# Patient Record
Sex: Male | Born: 1980 | Hispanic: No | Marital: Married | Smoking: Never smoker
Health system: Southern US, Community
[De-identification: ages and names within clinical notes are randomized; demographics above are authoritative.]

## PROBLEM LIST (undated history)

## (undated) DIAGNOSIS — R519 Headache, unspecified: Secondary | ICD-10-CM

## (undated) DIAGNOSIS — R51 Headache: Secondary | ICD-10-CM

## (undated) HISTORY — DX: Headache: R51

## (undated) HISTORY — DX: Headache, unspecified: R51.9

---

## 2016-06-19 ENCOUNTER — Emergency Department (HOSPITAL_COMMUNITY): Payer: 59

## 2016-06-19 ENCOUNTER — Encounter (HOSPITAL_COMMUNITY): Payer: Self-pay | Admitting: Emergency Medicine

## 2016-06-19 ENCOUNTER — Emergency Department (HOSPITAL_COMMUNITY)
Admission: EM | Admit: 2016-06-19 | Discharge: 2016-06-19 | Disposition: A | Discharge status: Home / Self Care | Payer: 59 | Attending: Emergency Medicine | Admitting: Emergency Medicine

## 2016-06-19 DIAGNOSIS — Z79899 Other long term (current) drug therapy: Secondary | ICD-10-CM | POA: Insufficient documentation

## 2016-06-19 DIAGNOSIS — R51 Headache: Secondary | ICD-10-CM | POA: Insufficient documentation

## 2016-06-19 DIAGNOSIS — R519 Headache, unspecified: Secondary | ICD-10-CM

## 2016-06-19 LAB — BASIC METABOLIC PANEL
Anion gap: 7 (ref 5–15)
BUN: 11 mg/dL (ref 6–20)
CHLORIDE: 101 mmol/L (ref 101–111)
CO2: 29 mmol/L (ref 22–32)
CREATININE: 0.91 mg/dL (ref 0.61–1.24)
Calcium: 9.2 mg/dL (ref 8.9–10.3)
GFR calc Af Amer: >60 mL/min (ref >60)
GFR calc non Af Amer: >60 mL/min (ref >60)
Glucose, Bld: 92 mg/dL (ref 65–99)
Potassium: 4 mmol/L (ref 3.5–5.1)
Sodium: 137 mmol/L (ref 135–145)

## 2016-06-19 LAB — CBC WITH DIFFERENTIAL/PLATELET
Basophils Absolute: 0 10*3/uL (ref 0.0–0.1)
Basophils Relative: 0 %
EOS ABS: 0.2 10*3/uL (ref 0.0–0.7)
EOS PCT: 2 %
HCT: 44.9 % (ref 39.0–52.0)
HEMOGLOBIN: 15.7 g/dL (ref 13.0–17.0)
LYMPHS ABS: 2.9 10*3/uL (ref 0.7–4.0)
Lymphocytes Relative: 34 %
MCH: 29.6 pg (ref 26.0–34.0)
MCHC: 35 g/dL (ref 30.0–36.0)
MCV: 84.6 fL (ref 78.0–100.0)
MONO ABS: 0.5 10*3/uL (ref 0.1–1.0)
MONOS PCT: 5 %
Neutro Abs: 5.1 10*3/uL (ref 1.7–7.7)
Neutrophils Relative %: 59 %
PLATELETS: 195 10*3/uL (ref 150–400)
RBC: 5.31 MIL/uL (ref 4.22–5.81)
RDW: 12.9 % (ref 11.5–15.5)
WBC: 8.6 10*3/uL (ref 4.0–10.5)

## 2016-06-19 MED ORDER — IOPAMIDOL (ISOVUE-370) INJECTION 76%
100.0000 mL | Freq: Once | INTRAVENOUS | Status: AC | PRN
  Administered 2016-06-19: 100 mL via INTRAVENOUS

## 2016-06-19 MED ORDER — IOPAMIDOL (ISOVUE-370) INJECTION 76%
INTRAVENOUS | Status: AC
  Administered 2016-06-19: 1 mL
  Filled 2016-06-19: qty 100

## 2016-06-19 NOTE — ED Provider Notes (Signed)
[facility]-EMERGENCY DEPT Provider Note   CSN: [id number] Arrival date & time: 06/19/16  1250     History   Chief Complaint Chief Complaint  Patient presents with  . Headache    HPI Sergio Hale is a 35 y.o. male.  The history is provided by the patient. No language interpreter was used.  Headache     Sergio Hale is a 35 y.o. male who presents to the Emergency Department complaining of headache.  He reports 5 days of headache. The headache is located at the top of his head and posterior. It is worse upon waking. It also worsens after intercourse for about one hour. He currently has no headache. He denies any fevers, nausea, vomiting, numbness, weakness, vision changes. No history of prior headaches. It does have partial improvement after taking ibuprofen. No recent head injury, carbon monoxide exposure, tick bites, illness. He has no past medical history. There is no family history of stroke or aneurysm. History reviewed. No pertinent past medical history.  There are no active problems to display for this patient.   History reviewed. No pertinent surgical history.     Home Medications    Prior to Admission medications   Medication Sig Start Date End Date Taking? Authorizing Provider  ibuprofen (ADVIL,MOTRIN) 200 MG tablet Take 200 mg by mouth every 6 (six) hours as needed for headache, mild pain or moderate pain.   Yes Historical Provider, MD    Family History No family history on file.  Social History Social History  Substance Use Topics  . Smoking status: Never Smoker  . Smokeless tobacco: Never Used  . Alcohol use No     Allergies   Patient has no known allergies.   Review of Systems Review of Systems  Neurological: Positive for headaches.  All other systems reviewed and are negative.    Physical Exam Updated Vital Signs BP 120/93 (BP Location: Right Arm)   Pulse 72   Temp 99 F (37.2 C) (Oral)   Resp 14   Ht 5\' 8"  (1.727 m)   Wt 175 lb  (79.4 kg)   SpO2 99%   BMI 26.61 kg/m   Physical Exam  Constitutional: He is oriented to person, place, and time. He appears well-developed and well-nourished.  HENT:  Head: Normocephalic and atraumatic.  Right Ear: External ear normal.  Left Ear: External ear normal.  Mouth/Throat: Oropharynx is clear and moist. No oropharyngeal exudate.  Eyes: EOM are normal. Pupils are equal, round, and reactive to light.  Neck: Normal range of motion. Neck supple.  Cardiovascular: Normal rate and regular rhythm.   No murmur heard. Pulmonary/Chest: Effort normal and breath sounds normal. No stridor. No respiratory distress.  Musculoskeletal: He exhibits no edema or tenderness.  Lymphadenopathy:    He has no cervical adenopathy.  Neurological: He is alert and oriented to person, place, and time. No cranial nerve deficit.  Skin: Skin is warm and dry.  Psychiatric: He has a normal mood and affect. His behavior is normal.  Nursing note and vitals reviewed.    ED Treatments / Results  Labs (all labs ordered are listed, but only abnormal results are displayed) Labs Reviewed  BASIC METABOLIC PANEL  CBC WITH DIFFERENTIAL/PLATELET    EKG  EKG Interpretation None       Radiology Ct Angio Head W/cm &/or Wo Cm  Result Date: 06/19/2016 CLINICAL DATA:  35 y/o M; five days of intermittent exertional headaches. EXAM: CT ANGIOGRAPHY HEAD TECHNIQUE: Multidetector CT imaging of the head  was performed using the standard protocol during bolus administration of intravenous contrast. Multiplanar CT image reconstructions and MIPs were obtained to evaluate the vascular anatomy. CONTRAST:  100 cc Isovue 370 COMPARISON:  None. FINDINGS: CT HEAD Brain: No evidence of acute infarction, hemorrhage, hydrocephalus, extra-axial collection or mass lesion/mass effect. Vascular: As below. Skull: Normal. Negative for fracture or focal lesion. Sinuses: Imaged portions are clear. Orbits: No acute finding. CTA HEAD Anterior  circulation: No proximal occlusion, aneurysm, or vascular malformation. Left A2 segments of moderate stenosis (series 14, image 19). Posterior circulation: No significant stenosis, proximal occlusion, aneurysm, or vascular malformation. Venous sinuses: As permitted by contrast timing, patent. Anatomic variants: Small anterior communicating artery and left posterior communicating artery. No right posterior communicating artery identified, likely hypoplastic or absent. Delayed phase: No abnormal intracranial enhancement. IMPRESSION: 1. Left A2 segment of moderate stenosis best appreciated on sagittal MIP reconstruction. Atherosclerotic disease would be atypical for patient's age. Findings suggest vasculitis or vasoconstriction possibly related to reversible cerebral vasoconstriction syndrome or drug reaction. No specific findings of dissection. 2. No aneurysm or large vessel occlusion of the circle of Willis identified. 3. No large acute infarct, focal mass effect, or intracranial hemorrhage on noncontrast CT of the head or abnormal enhancement of the brain on delayed postcontrast CT of head. Patent dural venous sinuses. These results were called by telephone at the time of interpretation on 06/19/2016 at 8:40 pm to Dr. Tilden FossaELIZABETH Kambrie Eddleman , who verbally acknowledged these results. Electronically Signed   By: Mitzi HansenLance  Furusawa-Stratton M.D.   On: 06/19/2016 20:44    Procedures Procedures (including critical care time)  Medications Ordered in ED Medications  iopamidol (ISOVUE-370) 76 % injection (1 mL  Contrast Given 06/19/16 1925)  iopamidol (ISOVUE-370) 76 % injection 100 mL (100 mLs Intravenous Contrast Given 06/19/16 1942)     Initial Impression / Assessment and Plan / ED Course  I have reviewed the triage vital signs and the nursing notes.  Pertinent labs & imaging results that were available during my care of the patient were reviewed by me and considered in my medical decision making (see chart for  details).  Clinical Course     Patient here for evaluation of waxing and waning headache that is worse with exertion. He is well appearing on examination with no neurologic deficits. CT scan demonstrates some stenosis that is likely not concerning to his headache symptoms. Plan to DC home with outpatient neurology follow-up. Current clinical picture is not consistent with CVA/TIA, subarachnoid, meningitis. Recommend the patient avoid heavy exertion until seen by neurology. Discussed taking ibuprofen, available over-the-counter as needed for headache.  Final Clinical Impressions(s) / ED Diagnoses   Final diagnoses:  Bad headache    New Prescriptions Discharge Medication List as of 06/19/2016  9:32 PM       Tilden FossaElizabeth Demarus Latterell, MD 06/20/16 [id number]

## 2016-06-19 NOTE — Discharge Instructions (Signed)
FINDINGS: CT HEAD   Brain: No evidence of acute infarction, hemorrhage, hydrocephalus, extra-axial collection or mass lesion/mass effect.   Vascular: As below.   Skull: Normal. Negative for fracture or focal lesion.   Sinuses: Imaged portions are clear.   Orbits: No acute finding.   CTA HEAD   Anterior circulation: No proximal occlusion, aneurysm, or vascular malformation.   Left A2 segments of moderate stenosis (series 14, image 19).   Posterior circulation: No significant stenosis, proximal occlusion, aneurysm, or vascular malformation.   Venous sinuses: As permitted by contrast timing, patent.   Anatomic variants: Small anterior communicating artery and left posterior communicating artery. No right posterior communicating artery identified, likely hypoplastic or absent.   Delayed phase: No abnormal intracranial enhancement.   IMPRESSION: 1. Left A2 segment of moderate stenosis best appreciated on sagittal MIP reconstruction. Atherosclerotic disease would be atypical for patient's age. Findings suggest vasculitis or vasoconstriction possibly related to reversible cerebral vasoconstriction syndrome or drug reaction. No specific findings of dissection. 2. No aneurysm or large vessel occlusion of the circle of Willis identified. 3. No large acute infarct, focal mass effect, or intracranial hemorrhage on noncontrast CT of the head or abnormal enhancement of the brain on delayed postcontrast CT of head. Patent dural venous sinuses.

## 2016-06-19 NOTE — ED Triage Notes (Signed)
patient sent from [facility] for further work up on headache. Patient states that intermittent headache over the past 5 days. patient states that worse with sexual activity. Patient states that 2-3 morning woke up with headache. Patient denies blurred vision or n/v.

## 2016-06-21 ENCOUNTER — Encounter: Payer: Self-pay | Admitting: Diagnostic Neuroimaging

## 2016-06-21 ENCOUNTER — Ambulatory Visit (INDEPENDENT_AMBULATORY_CARE_PROVIDER_SITE_OTHER): Payer: 59 | Admitting: Diagnostic Neuroimaging

## 2016-06-21 VITALS — BP 128/81 | HR 70 | Ht 69.0 in | Wt 178.4 lb

## 2016-06-21 DIAGNOSIS — G4484 Primary exertional headache: Secondary | ICD-10-CM | POA: Diagnosis not present

## 2016-06-21 MED ORDER — PROPRANOLOL HCL 20 MG PO TABS: 20.0000 mg | ORAL_TABLET | Freq: Two times a day (BID) | ORAL | 6 refills | Status: DC

## 2016-06-21 NOTE — Progress Notes (Signed)
[facility]  PATIENT: Sergio Hale DOB: [birth year]  REFERRING CLINICIAN: ER  HISTORY FROM: patient and wife  REASON FOR VISIT: new consult    HISTORICAL  CHIEF COMPLAINT:  Chief Complaint  Patient presents with  . Headache    rm 7, New Pt, wife- Rajee, "headache x 6-7 days on top of head and sometimes to the left"    HISTORY OF PRESENT ILLNESS:   35 year old right-handed male here for evaluation of headaches. For past 7-10 days patient has had new onset of pain in the top of his head. He feels a light throbbing and painful sensation. Symptoms are intermittent in intensity but overall fairly continuous. Headaches have worsened in the past one week associated with exertion or sexual activity. No nausea vomiting. No sensitivity to light or sound. Symptoms are better if he has had a full night of sleep.  No recent infection, trauma, triggering factors.  Patient went to the emergency room on 06/19/16 for evaluation after going to PCP office. CT angiogram and was obtained which showed moderate stenosis of left A2 segments, raising possibility of reversible cerebral visit construction syndrome versus atherosclerosis. No aneurysm or other findings were found.  Patient continues to have light headache today. No similar prior events.   REVIEW OF SYSTEMS: Full 14 system review of systems performed and negative with exception of: Headache sleepiness.   ALLERGIES: No Known Allergies  HOME MEDICATIONS: Outpatient Medications Prior to Visit  Medication Sig Dispense Refill  . ibuprofen (ADVIL,MOTRIN) 200 MG tablet Take 200 mg by mouth every 6 (six) hours as needed for headache, mild pain or moderate pain.     No facility-administered medications prior to visit.     PAST MEDICAL HISTORY: Past Medical History:  Diagnosis Date  . Headache     PAST SURGICAL HISTORY: History reviewed. No pertinent surgical history.  FAMILY HISTORY: History reviewed. No  pertinent family history.  SOCIAL HISTORY:  Social History   Social History  . Marital status: Married    Spouse name: Rajee  . Number of children: 0  . Years of education: 7818   Occupational History  .      [employer]   Social History Main Topics  . Smoking status: Never Smoker  . Smokeless tobacco: Never Used     Comment: in college  . Alcohol use Yes     Comment: 06/21/16 2 beers/wk  . Drug use: No  . Sexual activity: Not on file   Other Topics Concern  . Not on file   Social History Narrative  . No narrative on file     PHYSICAL EXAM  GENERAL EXAM/CONSTITUTIONAL: Vitals:  Vitals:   06/21/16 1002  BP: 128/81  Pulse: 70  Weight: 178 lb 6.4 oz (80.9 kg)  Height: 5\' 9"  (1.753 m)     Body mass index is 26.35 kg/m.  Visual Acuity Screening   Right eye Left eye Both eyes  Without correction: 20/20 20/20   With correction:        Patient is in no distress; well developed, nourished and groomed; neck is supple  CARDIOVASCULAR:  Examination of carotid arteries is normal; no carotid bruits  Regular rate and rhythm, no murmurs  Examination of peripheral vascular system by observation and palpation is normal  EYES:  Ophthalmoscopic exam of optic discs and posterior segments is normal; no papilledema or hemorrhages  MUSCULOSKELETAL:  Gait, strength, tone, movements noted in Neurologic exam below  NEUROLOGIC: MENTAL STATUS:  No flowsheet data  found.  awake, alert, oriented to person, place and time  recent and remote memory intact  normal attention and concentration  language fluent, comprehension intact, naming intact,   fund of knowledge appropriate  CRANIAL NERVE:   2nd - no papilledema on fundoscopic exam  2nd, 3rd, 4th, 6th - pupils equal and reactive to light, visual fields full to confrontation, extraocular muscles intact, no nystagmus  5th - facial sensation symmetric  7th - facial strength symmetric  8th - hearing  intact  9th - palate elevates symmetrically, uvula midline  11th - shoulder shrug symmetric  12th - tongue protrusion midline  MOTOR:   normal bulk and tone, full strength in the BUE, BLE  SENSORY:   normal and symmetric to light touch, temperature, vibration  COORDINATION:   finger-nose-finger, fine finger movements normal  REFLEXES:   deep tendon reflexes present and symmetric  GAIT/STATION:   narrow based gait; romberg is negative    DIAGNOSTIC DATA (LABS, IMAGING, TESTING) - I reviewed patient records, labs, notes, testing and imaging myself where available.  Lab Results  Component Value Date   WBC 8.6 06/19/2016   HGB 15.7 06/19/2016   HCT 44.9 06/19/2016   MCV 84.6 06/19/2016   PLT 195 06/19/2016      Component Value Date/Time   NA 137 06/19/2016 1844   K 4.0 06/19/2016 1844   CL 101 06/19/2016 1844   CO2 29 06/19/2016 1844   GLUCOSE 92 06/19/2016 1844   BUN 11 06/19/2016 1844   CREATININE 0.91 06/19/2016 1844   CALCIUM 9.2 06/19/2016 1844   GFRNONAA >60 06/19/2016 1844   GFRAA >60 06/19/2016 1844   No results found for: CHOL, HDL, LDLCALC, LDLDIRECT, TRIG, CHOLHDL No results found for: ZOXW9U No results found for: VITAMINB12 No results found for: TSH   06/19/16 CTA head [I reviewed images myself. Areas of decreased flow noted on MIP views but not on source images. -VRP]  1. Left A2 segment of moderate stenosis best appreciated on sagittal MIP reconstruction. Atherosclerotic disease would be atypical for patient's age. Findings suggest vasculitis or vasoconstriction possibly related to reversible cerebral vasoconstriction syndrome or drug reaction. No specific findings of dissection. 2. No aneurysm or large vessel occlusion of the circle of Willis identified. 3. No large acute infarct, focal mass effect, or intracranial hemorrhage on noncontrast CT of the head or abnormal enhancement of the brain on delayed postcontrast CT of head. Patent dural  venous sinuses.     ASSESSMENT AND PLAN  35 y.o. year old male here with new onset headache on vertex of head, sometimes aggravated by exertion or sexual activity. Could represent primary exertional headache versus other secondary headache. Neurologic exam unremarkable. CTA of the head raises possibility of left A2 segment stenosis raising possibility of focal cerebral vasoconstriction syndrome however this is not noted on axial source views. Will check additional workup.   EAV:WUJWJXB headache (primary exertional vs migraine headache) vs secondary headche (vasculitis, reversible cerebral vasoconstriction syndrome, inflammation, infection, vascular disease, stroke)  1. Exertional headache      PLAN: - check MRI brain (with and without) to rule out other causes of headache (stroke, bleeding, inflammation) - continue ibuprofen and tylenol as needed - trial of propranolol for primary exertion headache prevention  Orders Placed This Encounter  Procedures  . MR BRAIN W WO CONTRAST   Meds ordered this encounter  Medications  . propranolol (INDERAL) 20 MG tablet    Sig: Take 1 tablet (20 mg total) by mouth 2 (  two) times daily.    Dispense:  60 tablet    Refill:  6   Return in about 6 weeks (around 08/02/2016).  I reviewed images, labs, notes, records myself. I summarized findings and reviewed with patient, for this high risk condition (new onset headache; abnormal CTA head) requiring high complexity decision making.    Suanne MarkerVIKRAM R. PENUMALLI, MD 06/21/2016, 10:19 AM Certified in Neurology, Neurophysiology and Neuroimaging  [facility] [address], [address] [place], [place] [address] [phone]

## 2016-06-21 NOTE — Patient Instructions (Signed)
Thank you for coming to see [place] at [facility]. I hope we have been able to provide you high quality care today.  You may receive a patient satisfaction survey over the next few weeks. We would appreciate your feedback and comments so that we may continue to improve ourselves and the health of our patients.  - check MRI brain (with and without)  - continue ibuprofen and tylenol as needed  - start propranolol 32m twice daily   ~~~~~~~~~~~~~~~~~~~~~~~~~~~~~~~~~~~~~~~~~~~~~~~~~~~~~~~~~~~~~~~~~  DR. Luigi Stuckey'S GUIDE TO HAPPY AND HEALTHY LIVING These are some of my general health and wellness recommendations. Some of them may apply to you better than others. Please use common sense as you try these suggestions and feel free to ask me any questions.   ACTIVITY/FITNESS Mental, social, emotional and physical stimulation are very important for brain and body health. Try learning a new activity (arts, music, language, sports, games).  Keep moving your body to the best of your abilities. You can do this at home, inside or outside, the park, community center, gym or anywhere you like. Consider a physical therapist or personal trainer to get started. Consider the app Sworkit. Fitness trackers such as smart-watches, smart-phones or Fitbits can help as well.   NUTRITION Eat more plants: colorful vegetables, nuts, seeds and berries.  Eat less sugar, salt, preservatives and processed foods.  Avoid toxins such as cigarettes and alcohol.  Drink water when you are thirsty. Warm water with a slice of lemon is an excellent morning drink to start the day.  Consider these websites for more information The Nutrition Source (hhttps://www.henry-hernandez.biz/ [employer] Nutrition (wWindowBlog.ch   RELAXATION Consider practicing mindfulness meditation or other relaxation techniques such as deep breathing, prayer, yoga, tai chi, massage. See  website mindful.org or the apps Headspace or Calm to help get started.   SLEEP Try to get at least 7-8+ hours sleep per day. Regular exercise and reduced caffeine will help you sleep better. Practice good sleep hygeine techniques. See website sleep.org for more information.   PLANNING Prepare estate planning, living will, healthcare POA documents. Sometimes this is best planned with the help of an attorney. Theconversationproject.org and agingwithdignity.org are excellent resources.

## 2016-07-05 ENCOUNTER — Ambulatory Visit
Admission: RE | Admit: 2016-07-05 | Discharge: 2016-07-05 | Disposition: A | Discharge status: Home / Self Care | Payer: 59 | Source: Ambulatory Visit | Attending: Diagnostic Neuroimaging | Admitting: Diagnostic Neuroimaging

## 2016-07-05 DIAGNOSIS — G4484 Primary exertional headache: Secondary | ICD-10-CM

## 2016-07-05 MED ORDER — GADOBENATE DIMEGLUMINE 529 MG/ML IV SOLN
16.0000 mL | Freq: Once | INTRAVENOUS | Status: AC | PRN
  Administered 2016-07-05: 16 mL via INTRAVENOUS

## 2016-07-06 ENCOUNTER — Telehealth: Payer: Self-pay | Admitting: *Deleted

## 2016-07-06 NOTE — Telephone Encounter (Signed)
Per Dr Marjory LiesPenumalli, spoke with patient and informed him his MRI brain results are unremarkable. Advised Dr Marjory LiesPenumalli will continue with his current treatment plan; re: taking Inderal twice a day, Tylenol, Ibuprofen as need. Patient stated he is taking Inderal twice a day, but he has not noticed any improvement. This RN inquired if he is taking Ibuprofen or Tylenol. He stated "I have to take Ibuprofen 600 mg to get any relief at all."  He asked how long it would take for his headaches to improve. This RN advised that he has been on Inderal two weeks and may need more time to get full effects of medication.  Patient requested dr be asked. Advised this note will be sent to Dr Marjory LiesPenumalli, and he will get a call back tomorrow. He verbalized understanding, appreciation.

## 2016-07-06 NOTE — Telephone Encounter (Signed)
Increase propranolol to 40mg  twice a day. HA should improve within next 2-4 weeks. -VRP

## 2016-07-07 NOTE — Telephone Encounter (Signed)
Spoke with patient and advised him Dr Marjory LiesPenumalli recommends he increase propanolol to 40 mg twice a day. His headaches should improve over the next 2-4 weeks. Advised he call back in two weeks with update. If the increased dose is helping, a new prescription can be sent in. He verbalized understanding, appreciation.

## 2016-07-27 ENCOUNTER — Telehealth: Payer: Self-pay | Admitting: Diagnostic Neuroimaging

## 2016-07-27 NOTE — Telephone Encounter (Signed)
Returned patient's call to advise him he should have refills on file. He stated that because Dr Marjory LiesPenumalli increased his dose, he ran out. It is too early to refill with current Rx. He stated that he is getting better relief of headaches with increased dose. This RN advised she will call his pharmacy and give new Rx. Advised patient he will only get a call back if there is a problem with new Rx. He verbalized understanding, appreciation.  This RN called his pharmacy and gave verbal order for Inderal 20 mg tabs, take two tabs twice daily, disp #120, 5 refills.

## 2016-07-27 NOTE — Telephone Encounter (Signed)
Patient needs refill of Inderal before apt on Tuesday. Best call back [phone]

## 2016-08-01 ENCOUNTER — Encounter: Payer: Self-pay | Admitting: Diagnostic Neuroimaging

## 2016-08-01 ENCOUNTER — Ambulatory Visit (INDEPENDENT_AMBULATORY_CARE_PROVIDER_SITE_OTHER): Payer: 59 | Admitting: Diagnostic Neuroimaging

## 2016-08-01 VITALS — BP 119/76 | HR 72 | Wt 179.6 lb

## 2016-08-01 DIAGNOSIS — G4484 Primary exertional headache: Secondary | ICD-10-CM | POA: Diagnosis not present

## 2016-08-01 MED ORDER — PROPRANOLOL HCL 20 MG PO TABS: 20.0000 mg | ORAL_TABLET | Freq: Two times a day (BID) | ORAL | 12 refills | Status: AC

## 2016-08-01 NOTE — Progress Notes (Signed)
[facility]  PATIENT: Sergio Hale DOB: June 20, 1981  REFERRING CLINICIAN:  HISTORY FROM: patient REASON FOR VISIT: follow up    HISTORICAL  CHIEF COMPLAINT:  Chief Complaint  Patient presents with  . Exertional headache    rm 7, "after increasing Inderal to 40 mg twice daily headaches are better"  . Follow-up    6 week    HISTORY OF PRESENT ILLNESS:   UPDATE 08/01/16: Since last visit, doing well. On propranolol 40mg  twice a day and HA are essentially resolved. MRI brain completed and normal. No other issues or problems.  PRIOR HPI (06/21/16): 36 year old right-handed male here for evaluation of headaches. For past 7-10 days patient has had new onset of pain in the top of his head. He feels a light throbbing and painful sensation. Symptoms are intermittent in intensity but overall fairly continuous. Headaches have worsened in the past one week associated with exertion or sexual activity. No nausea vomiting. No sensitivity to light or sound. Symptoms are better if he has had a full night of sleep. No recent infection, trauma, triggering factors. Patient went to the emergency room on 06/19/16 for evaluation after going to PCP office. CT angiogram and was obtained which showed moderate stenosis of left A2 segments, raising possibility of reversible cerebral visit construction syndrome versus atherosclerosis. No aneurysm or other findings were found. Patient continues to have light headache today. No similar prior events.   REVIEW OF SYSTEMS: Full 14 system review of systems performed and negative with exception of: only as per HPI.   ALLERGIES: Allergies  Allergen Reactions  . Gadolinium Derivatives Itching    Pt experienced mild hives with itching after contrast injection. Checked by Dr.Mattern and he asked pt to wait in building until hives subsided as pt did not want to take benadryl. Rechecked by Dr Alfredo BattyMattern before he left.    HOME MEDICATIONS: Outpatient  Medications Prior to Visit  Medication Sig Dispense Refill  . ibuprofen (ADVIL,MOTRIN) 200 MG tablet Take 200 mg by mouth every 6 (six) hours as needed for headache, mild pain or moderate pain.    Marland Kitchen. propranolol (INDERAL) 20 MG tablet Take 1 tablet (20 mg total) by mouth 2 (two) times daily. 60 tablet 6   No facility-administered medications prior to visit.     PAST MEDICAL HISTORY: Past Medical History:  Diagnosis Date  . Headache     PAST SURGICAL HISTORY: History reviewed. No pertinent surgical history.  FAMILY HISTORY: History reviewed. No pertinent family history.  SOCIAL HISTORY:  Social History   Social History  . Marital status: Married    Spouse name: Rajee  . Number of children: 0  . Years of education: 1518   Occupational History  .      [employer]   Social History Main Topics  . Smoking status: Never Smoker  . Smokeless tobacco: Never Used     Comment: in college  . Alcohol use Yes     Comment: 06/21/16 2 beers/wk  . Drug use: No  . Sexual activity: Not on file   Other Topics Concern  . Not on file   Social History Narrative  . No narrative on file     PHYSICAL EXAM  GENERAL EXAM/CONSTITUTIONAL: Vitals:  Vitals:   08/01/16 1526  BP: 119/76  Pulse: 72  Weight: 179 lb 9.6 oz (81.5 kg)   Body mass index is 26.52 kg/m. No exam data present  Patient is in no distress; well developed, nourished and groomed; neck  is supple  CARDIOVASCULAR:  Examination of carotid arteries is normal; no carotid bruits  Regular rate and rhythm, no murmurs  Examination of peripheral vascular system by observation and palpation is normal  EYES:  Ophthalmoscopic exam of optic discs and posterior segments is normal; no papilledema or hemorrhages  MUSCULOSKELETAL:  Gait, strength, tone, movements noted in Neurologic exam below  NEUROLOGIC: MENTAL STATUS:  No flowsheet data found.  awake, alert, oriented to person, place and time  recent and remote  memory intact  normal attention and concentration  language fluent, comprehension intact, naming intact,   fund of knowledge appropriate  CRANIAL NERVE:   2nd - no papilledema on fundoscopic exam  2nd, 3rd, 4th, 6th - pupils equal and reactive to light, visual fields full to confrontation, extraocular muscles intact, no nystagmus  5th - facial sensation symmetric  7th - facial strength symmetric  8th - hearing intact  9th - palate elevates symmetrically, uvula midline  11th - shoulder shrug symmetric  12th - tongue protrusion midline  MOTOR:   normal bulk and tone, full strength in the BUE, BLE  SENSORY:   normal and symmetric to light touch, temperature, vibration  COORDINATION:   finger-nose-finger, fine finger movements normal  REFLEXES:   deep tendon reflexes present and symmetric  GAIT/STATION:   narrow based gait; romberg is negative    DIAGNOSTIC DATA (LABS, IMAGING, TESTING) - I reviewed patient records, labs, notes, testing and imaging myself where available.  Lab Results  Component Value Date   WBC 8.6 06/19/2016   HGB 15.7 06/19/2016   HCT 44.9 06/19/2016   MCV 84.6 06/19/2016   PLT 195 06/19/2016      Component Value Date/Time   NA 137 06/19/2016 1844   K 4.0 06/19/2016 1844   CL 101 06/19/2016 1844   CO2 29 06/19/2016 1844   GLUCOSE 92 06/19/2016 1844   BUN 11 06/19/2016 1844   CREATININE 0.91 06/19/2016 1844   CALCIUM 9.2 06/19/2016 1844   GFRNONAA >60 06/19/2016 1844   GFRAA >60 06/19/2016 1844   No results found for: CHOL, HDL, LDLCALC, LDLDIRECT, TRIG, CHOLHDL No results found for: ZOXW9U No results found for: VITAMINB12 No results found for: TSH   06/19/16 CTA head [I reviewed images myself. Areas of decreased flow noted on MIP views but not on source images. -VRP]  1. Left A2 segment of moderate stenosis best appreciated on sagittal MIP reconstruction. Atherosclerotic disease would be atypical for patient's age.  Findings suggest vasculitis or vasoconstriction possibly related to reversible cerebral vasoconstriction syndrome or drug reaction. No specific findings of dissection. 2. No aneurysm or large vessel occlusion of the circle of Willis identified. 3. No large acute infarct, focal mass effect, or intracranial hemorrhage on noncontrast CT of the head or abnormal enhancement of the brain on delayed postcontrast CT of head. Patent dural venous sinuses.  07/05/16 MRI brain [I reviewed images myself and agree with interpretation. -VRP]   - normal      ASSESSMENT AND PLAN  36 y.o. year old male here with new onset headache (Dec 2017) on vertex of head, sometimes aggravated by exertion or sexual activity. Could represent primary exertional headache versus other secondary headache. Neurologic exam unremarkable. CTA of the head raises possibility of left A2 segment stenosis raising possibility of focal cerebral vasoconstriction syndrome however this is not noted on axial source views. MRI brain was normal. Headaches are improved on propranolol.   Ddx: primary headache (primary exertional)  1. Exertional headache  PLAN: - continue ibuprofen and tylenol as needed - continue propranolol for primary exertion headache prevention  Meds ordered this encounter  Medications  . propranolol (INDERAL) 20 MG tablet    Sig: Take 1-2 tablets (20-40 mg total) by mouth 2 (two) times daily.    Dispense:  120 tablet    Refill:  12   Return in about 4 months (around 11/29/2016).    Suanne Marker, MD 08/01/2016, 3:44 PM Certified in Neurology, Neurophysiology and Neuroimaging  [facility] [address] [place], [place] [address] [phone]

## 2016-12-04 ENCOUNTER — Ambulatory Visit: Payer: 59 | Admitting: Diagnostic Neuroimaging

## 2018-06-17 DIAGNOSIS — Z23 Encounter for immunization: Secondary | ICD-10-CM | POA: Diagnosis not present

## 2018-07-30 DIAGNOSIS — Z13220 Encounter for screening for lipoid disorders: Secondary | ICD-10-CM | POA: Diagnosis not present

## 2018-07-30 DIAGNOSIS — E559 Vitamin D deficiency, unspecified: Secondary | ICD-10-CM | POA: Diagnosis not present

## 2018-07-30 DIAGNOSIS — Z131 Encounter for screening for diabetes mellitus: Secondary | ICD-10-CM | POA: Diagnosis not present

## 2018-07-30 DIAGNOSIS — Z0000 Encounter for general adult medical examination without abnormal findings: Secondary | ICD-10-CM | POA: Diagnosis not present
